# Patient Record
Sex: Female | Born: 2005 | Race: White | Hispanic: No | Marital: Single | Smoking: Never smoker
Health system: Southern US, Community
[De-identification: ages and names within clinical notes are randomized; demographics above are authoritative.]

## PROBLEM LIST (undated history)

## (undated) DIAGNOSIS — R638 Other symptoms and signs concerning food and fluid intake: Secondary | ICD-10-CM

## (undated) DIAGNOSIS — IMO0001 Reserved for inherently not codable concepts without codable children: Secondary | ICD-10-CM

## (undated) DIAGNOSIS — Q970 Karyotype 47, XXX: Secondary | ICD-10-CM

## (undated) DIAGNOSIS — T7840XA Allergy, unspecified, initial encounter: Secondary | ICD-10-CM

## (undated) DIAGNOSIS — R0681 Apnea, not elsewhere classified: Secondary | ICD-10-CM

## (undated) DIAGNOSIS — J45909 Unspecified asthma, uncomplicated: Secondary | ICD-10-CM

## (undated) DIAGNOSIS — K219 Gastro-esophageal reflux disease without esophagitis: Secondary | ICD-10-CM

## (undated) DIAGNOSIS — Q929 Trisomy and partial trisomy of autosomes, unspecified: Secondary | ICD-10-CM

## (undated) HISTORY — DX: Gastro-esophageal reflux disease without esophagitis: K21.9

## (undated) HISTORY — PX: ELBOW CLOSED REDUCTION W/ PERCUANEOUS PINNING: SHX1492

## (undated) HISTORY — DX: Unspecified asthma, uncomplicated: J45.909

## (undated) HISTORY — DX: Allergy, unspecified, initial encounter: T78.40XA

## (undated) HISTORY — DX: Trisomy and partial trisomy of autosomes, unspecified: Q92.9

## (undated) HISTORY — DX: Reserved for inherently not codable concepts without codable children: IMO0001

## (undated) HISTORY — DX: Apnea, not elsewhere classified: R06.81

---
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
## 2023-02-05 ENCOUNTER — Other Ambulatory Visit: Payer: Self-pay | Admitting: Nurse Practitioner

## 2023-02-05 DIAGNOSIS — E119 Type 2 diabetes mellitus without complications: Secondary | ICD-10-CM

## 2023-02-26 ENCOUNTER — Ambulatory Visit (INDEPENDENT_AMBULATORY_CARE_PROVIDER_SITE_OTHER): Payer: Medicaid Other | Admitting: Family Medicine

## 2023-02-26 ENCOUNTER — Encounter: Payer: Self-pay | Admitting: Family Medicine

## 2023-02-26 ENCOUNTER — Ambulatory Visit: Payer: Self-pay | Admitting: Nurse Practitioner

## 2023-02-26 VITALS — BP 100/69 | HR 91 | Ht 72.0 in | Wt 324.0 lb

## 2023-02-26 DIAGNOSIS — S86802A Unspecified injury of other muscle(s) and tendon(s) at lower leg level, left leg, initial encounter: Secondary | ICD-10-CM

## 2023-02-26 MED ORDER — IBUPROFEN 600 MG PO TABS
600.0000 mg | ORAL_TABLET | Freq: Three times a day (TID) | ORAL | 0 refills | Status: AC | PRN
Start: 2023-02-26 — End: ?

## 2023-02-26 NOTE — Progress Notes (Signed)
BP 100/69   Pulse 91   Ht 6' (1.829 m)   Wt (!) 324 lb (147 kg)   SpO2 98%   BMI 43.94 kg/m    Subjective:   Patient ID: Rachel Joseph, female    DOB: Apr 06, 2005, 17 y.o.   MRN: [id number]  HPI: Rachel Joseph is a 17 y.o. female presenting on 02/26/2023 for left leg pain (Injury occurred back in October/Swelling started back up 1 week ago) and Medical Management of Chronic Issues   HPI Left calf pain and knee pain and swelling Patient is coming in with complaints of left calf pain and pain and swelling behind her knee and down into her leg.  She says this all started about a month ago when she slipped and felt some sharp pain in her left calf and then had some swelling.  That lasted a week or 2 and then calm to down but then recently over the past week she started having that same pain in the same area that she aggravated before.  She is also has a little bit of swelling in the knee and down the leg as well that she has noticed.  She is able to ambulate but it does hurt to ambulate in the back of her calf.  She says sometimes her knee will pop and catch at times.  Relevant past medical, surgical, family and social history reviewed and updated as indicated. Interim medical history since our last visit reviewed. Allergies and medications reviewed and updated.  Review of Systems  Constitutional:  Negative for chills and fever.  HENT:  Negative for congestion, ear discharge and ear pain.   Eyes:  Negative for visual disturbance.  Respiratory:  Negative for chest tightness and shortness of breath.   Cardiovascular:  Negative for chest pain and leg swelling.  Genitourinary:  Negative for difficulty urinating and dysuria.  Musculoskeletal:  Positive for arthralgias, joint swelling and myalgias. Negative for back pain and gait problem.  Skin:  Negative for rash.  Neurological:  Negative for light-headedness and headaches.  Psychiatric/Behavioral:  Negative for agitation and behavioral  problems.   All other systems reviewed and are negative.   Per HPI unless specifically indicated above   Allergies as of 02/26/2023       Reactions   Amoxicillin Rash        Medication List        Accurate as of February 26, 2023  3:29 PM. If you have any questions, ask your nurse or doctor.          STOP taking these medications    ibuprofen 100 MG/5ML suspension Commonly known as: ADVIL Replaced by: ibuprofen 600 MG tablet Stopped by: Elige Radon Wylan Gentzler       TAKE these medications    Accu-Chek Guide test strip Generic drug: glucose blood Check BS in AM, Noon and QHS Dx E11.65   Accu-Chek Softclix Lancets lancets Check BS in AM, Noon and QHS Dx E11.65   albuterol 108 (90 Base) MCG/ACT inhaler Commonly known as: VENTOLIN HFA Inhale 2 puffs into the lungs every 6 (six) hours as needed for wheezing or shortness of breath.   Blood Glucose Monitoring Suppl Devi 1 each by Does not apply route in the morning, at noon, and at bedtime. May substitute to any manufacturer covered by patient's insurance.   ibuprofen 600 MG tablet Commonly known as: ADVIL Take 1 tablet (600 mg total) by mouth every 8 (eight) hours as needed. Replaces: ibuprofen  100 MG/5ML suspension Started by: Elige Radon Denny Lave   Ozempic (2 MG/DOSE) 8 MG/3ML Sopn Generic drug: Semaglutide (2 MG/DOSE) INJECT 2 MG SUBCUTANEOUSLY ONCE A WEEK   valACYclovir 1000 MG tablet Commonly known as: VALTREX 2 po bid for 1 day at fever blister onset         Objective:   BP 100/69   Pulse 91   Ht 6' (1.829 m)   Wt (!) 324 lb (147 kg)   SpO2 98%   BMI 43.94 kg/m   Wt Readings from Last 3 Encounters:  02/26/23 (!) 324 lb (147 kg) (>99%, Z= 2.97)*  11/09/22 (!) 328 lb (148.8 kg) (>99%, Z= 2.96)*  10/17/22 (!) 338 lb (153.3 kg) (>99%, Z= 2.99)*   * Growth percentiles are based on CDC (Girls, 2-20 Years) data.    Physical Exam Vitals and nursing note reviewed.  Constitutional:      General:  She is not in acute distress.    Appearance: She is well-developed. She is not diaphoretic.  Eyes:     Conjunctiva/sclera: Conjunctivae normal.  Musculoskeletal:        General: No swelling. Normal range of motion.       Legs:  Skin:    General: Skin is warm and dry.     Findings: No rash.  Neurological:     Mental Status: She is alert and oriented to person, place, and time.     Coordination: Coordination normal.  Psychiatric:        Behavior: Behavior normal.       Assessment & Plan:   Problem List Items Addressed This Visit   None Visit Diagnoses       Injury of left plantaris muscle or tendon    -  Primary   Relevant Medications   ibuprofen (ADVIL) 600 MG tablet     Based on exam, likely plantaris muscle tendon.  She also possibly has a meniscal injury as well but let see if we can calm it down first. Recommended exercise and ice and heat and anti-inflammatories and stretching  Possibility with the popping in her knee that she could have a little bit of a meniscus injury as well but we will see how that comes after this plantaris muscle strain or tear calms down. Follow up plan: Return if symptoms worsen or fail to improve.  Counseling provided for all of the vaccine components No orders of the defined types were placed in this encounter.   Arville Care, MD [facility] 02/26/2023, 3:29 PM

## 2023-02-26 NOTE — Telephone Encounter (Signed)
Copied from CRM [id number]. Topic: Clinical - Red Word Triage >> Feb 26, 2023  8:19 AM Ivette P wrote: Red Word that prompted transfer to Nurse Triage: swelling, left calf down.  Chief Complaint: left leg pain Symptoms: Left calf, ankle, foot -below knee pain and swelling Frequency: 3 or 4 days ago Pertinent Negatives: Patient denies SOB or chest pain Disposition: [] ED /[] Urgent Care (no appt availability in office) / [x] Appointment(In office/virtual)/ []  [place] [facility]/ [] Home Care/ [] Refused Recommended Disposition /[] [place] [facility]/ []  Follow-up with PCP Additional Notes: pt's mom called per pt request to schedule appt regarding left leg knee and below pain and swelling.  Pt now limping due to discomfort.  Appt scheduled for today in same office as PCP.  Education given to Mom regarding s/s watch for related to DVT and need to go to ED: Mom verbalized understanding.    Reason for Disposition  SEVERE swelling (e.g., can't move swollen ankle at all)  Answer Assessment - Initial Assessment Questions 1. LOCATION: "Which ankle is swollen?" "Where is the swelling?"     Left calf, ankle, foot -knee and below  swelling and pain 2. ONSET: "When did the swelling start?"     Slipped couple weeks and swelling 3 or 4 days ago 3. SWELLING: "How bad is the swelling?" Or, "How large is it?" (e.g., mild, moderate, severe; size of localized swelling)    - NONE: No joint swelling.   - LOCALIZED: Localized; small area of puffy or swollen skin (e.g., insect bite, skin irritation).   - MILD: Joint looks or feels mildly swollen or puffy.   - MODERATE: Swollen; interferes with normal activities (e.g., work or school); decreased range of movement; may be limping.   - SEVERE: Very swollen; can't move swollen joint at all; limping a lot or unable to walk.     Moderate amount swelling - and longer pt stands the worse it hurts 4. PAIN: "Is there any pain?" If Yes, ask: "How bad is it?" (Scale 1-10; or  mild, moderate, severe)   - NONE (0): no pain.   - MILD (1-3): doesn't interfere with normal activities.    - MODERATE (4-7): interferes with normal activities (e.g., work or school) or awakens from sleep, limping.    - SEVERE (8-10): excruciating pain, unable to do any normal activities, unable to walk.      Limping due to discomfort when walking  5. CAUSE: "What do you think caused the ankle swelling?"     unknown 6. OTHER SYMPTOMS: "Do you have any other symptoms?" (e.g., fever, chest pain, difficulty breathing, calf pain)     Some Calf pain  7. PREGNANCY: "Is there any chance you are pregnant?" "When was your last menstrual period?"      no  Protocols used: Ankle Swelling-A-AH

## 2023-04-23 ENCOUNTER — Encounter: Payer: Self-pay | Admitting: Nurse Practitioner

## 2023-04-23 ENCOUNTER — Ambulatory Visit: Payer: Medicaid Other | Admitting: Nurse Practitioner

## 2023-04-23 VITALS — BP 132/85 | HR 83 | Temp 96.9°F | Ht 72.0 in | Wt 323.0 lb

## 2023-04-23 DIAGNOSIS — M25562 Pain in left knee: Secondary | ICD-10-CM | POA: Diagnosis not present

## 2023-04-23 DIAGNOSIS — Z0001 Encounter for general adult medical examination with abnormal findings: Secondary | ICD-10-CM

## 2023-04-23 DIAGNOSIS — Z7984 Long term (current) use of oral hypoglycemic drugs: Secondary | ICD-10-CM

## 2023-04-23 DIAGNOSIS — Z0000 Encounter for general adult medical examination without abnormal findings: Secondary | ICD-10-CM

## 2023-04-23 DIAGNOSIS — E119 Type 2 diabetes mellitus without complications: Secondary | ICD-10-CM

## 2023-04-23 LAB — BAYER DCA HB A1C WAIVED: HB A1C (BAYER DCA - WAIVED): 5.4 % (ref 4.8–5.6)

## 2023-04-23 MED ORDER — OZEMPIC (2 MG/DOSE) 8 MG/3ML ~~LOC~~ SOPN
2.0000 mg | PEN_INJECTOR | SUBCUTANEOUS | 1 refills | Status: DC
Start: 2023-04-23 — End: 2023-04-25

## 2023-04-23 MED ORDER — PREDNISONE 20 MG PO TABS: 40.0000 mg | ORAL_TABLET | Freq: Every day | ORAL | 0 refills | Status: AC

## 2023-04-23 NOTE — Progress Notes (Signed)
 Subjective:    Patient ID: Rachel Joseph, female    DOB: Dec 25, 2005, 18 y.o.   MRN: [id number]   Chief Complaint: annual physical    HPI:  Rachel Joseph is a 18 y.o. who identifies as a female who was assigned female at birth.   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 Diabetes mellitus treated with oral medication (HCC) Fasting blood sugars have been running around 80-110. Lab Results  Component Value Date   HGBA1C 5.5 10/17/2022     2. 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 Functions well.  3. Morbid obesity with BMI of 40.0-44.9, adult (HCC) No recent weight changes  Wt Readings from Last 3 Encounters:  04/23/23 (!) 323 lb (146.5 kg) (>99%, Z= 2.98)*  02/26/23 (!) 324 lb (147 kg) (>99%, Z= 2.97)*  11/09/22 (!) 328 lb (148.8 kg) (>99%, Z= 2.96)*   * Growth percentiles are based on CDC (Girls, 2-20 Years) data.   BMI Readings from Last 3 Encounters:  04/23/23 43.81 kg/m (>99%, Z= 2.51)*  02/26/23 43.94 kg/m (>99%, Z= 2.54)*  11/09/22 44.48 kg/m (>99%, Z= 2.63)*   * Growth percentiles are based on CDC (Girls, 2-20 Years) data.      New complaints: Left knee pain- has been hurting for 2-3 weeks after a fall. Sight effusion  Allergies  Allergen Reactions   Amoxicillin Rash   Outpatient Encounter Medications as of 04/23/2023  Medication Sig   Accu-Chek Softclix Lancets lancets Check BS in AM, Noon and QHS Dx E11.65   albuterol (VENTOLIN HFA) 108 (90 Base) MCG/ACT inhaler Inhale 2 puffs into the lungs every 6 (six) hours as needed for wheezing or shortness of breath.   Blood Glucose Monitoring Suppl DEVI 1 each by Does not apply route in the morning, at noon, and at bedtime. May substitute to any manufacturer covered by patient's insurance.   glucose blood (ACCU-CHEK GUIDE) test strip Check BS in AM, Noon and QHS Dx E11.65   ibuprofen  (ADVIL) 600 MG tablet Take 1 tablet (600 mg total) by mouth every 8 (eight) hours as needed.   Semaglutide, 2 MG/DOSE, (OZEMPIC, 2 MG/DOSE,) 8 MG/3ML SOPN INJECT 2 MG SUBCUTANEOUSLY ONCE A WEEK   valACYclovir (VALTREX) 1000 MG tablet 2 po bid for 1 day at fever blister onset   No facility-administered encounter medications on file as of 04/23/2023.    Past Surgical History:  Procedure Laterality Date   ELBOW CLOSED REDUCTION W/ PERCUANEOUS PINNING     WRIST ARTHROSCOPY WITH DEBRIDEMENT Left 06/11/2019   Procedure: WRIST ARTHROSCOPY WITH DEBRIDEMENT;  Surgeon: Bradly Bienenstock, MD;  Location: [facility];  Service: Orthopedics;  Laterality: Left;  with IV sedation    Family History  Problem Relation Age of Onset   Diabetes Mother    Neuropathy Mother    Polycystic ovary syndrome Mother    Heart attack Father    Hyperlipidemia Father    Hypertension Father       Controlled substance contract: n/a     Review of Systems  Constitutional:  Negative for diaphoresis.  Eyes:  Negative for pain.  Respiratory:  Negative for shortness of breath.   Cardiovascular:  Negative for chest pain, palpitations and leg swelling.  Gastrointestinal:  Negative for abdominal pain.  Endocrine: Negative for polydipsia.  Skin:  Negative for rash.  Neurological:  Negative for dizziness, weakness  and headaches.  Hematological:  Does not bruise/bleed easily.  All other systems reviewed and are negative.      Objective:   Physical Exam Vitals and nursing note reviewed.  Constitutional:      General: She is not in acute distress.    Appearance: Normal appearance. She is well-developed.  HENT:     Head: Normocephalic.     Right Ear: Tympanic membrane normal.     Left Ear: Tympanic membrane normal.     Nose: Nose normal.     Mouth/Throat:     Mouth: Mucous membranes are moist.  Eyes:     Pupils: Pupils are equal, round, and reactive to light.  Neck:     Vascular: No carotid bruit or  JVD.  Cardiovascular:     Rate and Rhythm: Normal rate and regular rhythm.     Heart sounds: Normal heart sounds.  Pulmonary:     Effort: Pulmonary effort is normal. No respiratory distress.     Breath sounds: Normal breath sounds. No wheezing or rales.  Chest:     Chest wall: No tenderness.  Abdominal:     General: Bowel sounds are normal. There is no distension or abdominal bruit.     Palpations: Abdomen is soft. There is no hepatomegaly, splenomegaly, mass or pulsatile mass.     Tenderness: There is no abdominal tenderness.  Musculoskeletal:        General: Normal range of motion.     Cervical back: Normal range of motion and neck supple.     Comments: FROM of left knee with no pain Mild effusion All ligaments intact  Lymphadenopathy:     Cervical: No cervical adenopathy.  Skin:    General: Skin is warm and dry.  Neurological:     Mental Status: She is alert and oriented to person, place, and time.     Deep Tendon Reflexes: Reflexes are normal and symmetric.  Psychiatric:        Behavior: Behavior normal.        Thought Content: Thought content normal.        Judgment: Judgment normal.    BP 132/85   Pulse 83   Temp (!) 96.9 F (36.1 C) (Temporal)   Ht 6' (1.829 m)   Wt (!) 323 lb (146.5 kg)   SpO2 97%   BMI 43.81 kg/m    Hgba1c 5.4%       Assessment & Plan:  Rachel Joseph comes in today with chief complaint of medical management of chronic issues    Diagnosis and orders addressed:  1. Diabetes mellitus treated with oral medication (HCC) Low carb diet - Bayer DCA Hb A1c Waived - Microalbumin / creatinine urine ratio - Semaglutide, 2 MG/DOSE, 8 MG/3ML SOPN; Inject 2 mg as directed once a week.  Dispense: 3 mL; Refill: 3  2. Trisomy X syndrome  3. Morbid obesity with BMI of 40.0-44.9, adult (HCC) Discussed diet and exercise for person with BMI >25 Will recheck weight in 3-6 months  - CBC with Differential/Platelet - CMP14+EGFR - Lipid panel  4.  Left knee pain Prednisone 20mg  2 tablets daily for 5 days May increase blood sugars Labs pending Health Maintenance reviewed Diet and exercise encouraged  Follow up plan: 6 months   Mary-Margaret [name], FNP

## 2023-04-23 NOTE — Patient Instructions (Signed)
 Acute Knee Pain, Adult Many things can cause knee pain. Sometimes, knee pain is sudden (acute). It may be caused by damage, swelling, or irritation of the muscles and tissues that support your knee. Pain may come from: A fall. An injury to the knee from twisting motions. A hit to the knee. Infection. The pain often goes away on its own with time and rest. If the pain does not go away, tests may be done to find out what is causing the pain. These may include: Imaging tests, such as an X-ray, MRI, CT scan, or ultrasound. Joint aspiration. In this test, fluid is removed from the knee and checked. Arthroscopy. In this test, a lighted tube is put in the knee and an image is shown on a screen. A biopsy. In this test, a health care provider will remove a small piece of tissue for testing. Follow these instructions at home: If you have a knee sleeve or brace that can be taken off:  Wear the knee sleeve or brace as told by your provider. Take it off only if your provider says that you can. Check the skin around it every day. Tell your provider if you see problems. Loosen the knee sleeve or brace if your toes tingle, are numb, or turn cold and blue. Keep the knee sleeve or brace clean and dry. Bathing If the knee sleeve or brace is not waterproof: Do not let it get wet. Cover it when you take a bath or shower. Use a cover that does not let any water in. Managing pain, stiffness, and swelling  If told, put ice on the area. If you have a knee sleeve or brace that you can take off, remove it as told. Put ice in a plastic bag. Place a towel between your skin and the bag. Leave the ice on for 20 minutes, 2-3 times a day. If your skin turns bright red, take off the ice right away to prevent skin damage. The risk of damage is higher if you cannot feel pain, heat, or cold. Move your toes often to reduce stiffness and swelling. Raise the injured area above the level of your heart while you are sitting  or lying down. Use a pillow to support your foot as needed. If told, use an elastic bandage to put pressure (compression) on your injured knee. This may control swelling, give support, and help with discomfort. Sleep with a pillow under your knee. Activity Rest your knee. Do not do things that cause pain or make pain worse. Do not stand or walk on your injured knee until you're told it's okay. Use crutches as told. Avoid activities where both feet leave the ground at the same time and put stress on the joints. Avoid running, jumping rope, and doing jumping jacks. Work with a physical therapist to make a safe exercise program if told. Physical therapy helps your knee move better and get stronger. Exercise as told. General instructions Take your medicines only as told by your provider. If you are overweight, work with your provider and an expert in healthy eating, called a dietician, to set goals to lose weight. Being overweight can make your knee hurt more. Do not smoke, vape, or use products with nicotine or tobacco in them. If you need help quitting, talk with your provider. Return to normal activities when you are told. Ask what things are safe for you to do. Watch for any changes in your symptoms. Keep all follow-up visits. Your provider will check  your healing and adjust treatments if needed. Contact a health care provider if: The knee pain does not stop. The knee pain changes or gets worse. You have a fever along with knee pain. Your knee is red or feels warm when you touch it. Your knee gives out or locks up. Get help right away if: Your knee swells and the swelling gets worse. You cannot move your knee. You have very bad knee pain that does not get better with medicine. This information is not intended to replace advice given to you by your health care provider. Make sure you discuss any questions you have with your health care provider. Document Revised: 11/16/2022 Document  Reviewed: 04/10/2022 Elsevier Patient Education  2024 [employer].

## 2023-04-24 LAB — CMP14+EGFR
ALT: 18 [IU]/L (ref 0–32)
AST: 18 [IU]/L (ref 0–40)
Albumin: 4.1 g/dL (ref 4.0–5.0)
Alkaline Phosphatase: 91 [IU]/L (ref 42–106)
BUN/Creatinine Ratio: 17 (ref 9–23)
BUN: 9 mg/dL (ref 6–20)
Bilirubin Total: 0.4 mg/dL (ref 0.0–1.2)
CO2: 22 mmol/L (ref 20–29)
Calcium: 9.4 mg/dL (ref 8.7–10.2)
Chloride: 105 mmol/L (ref 96–106)
Creatinine, Ser: 0.53 mg/dL — ABNORMAL LOW (ref 0.57–1.00)
Globulin, Total: 3.1 g/dL (ref 1.5–4.5)
Glucose: 82 mg/dL (ref 70–99)
Potassium: 5 mmol/L (ref 3.5–5.2)
Sodium: 142 mmol/L (ref 134–144)
Total Protein: 7.2 g/dL (ref 6.0–8.5)
eGFR: 137 mL/min/{1.73_m2} (ref >59)

## 2023-04-24 LAB — CBC WITH DIFFERENTIAL/PLATELET
Basophils Absolute: 0.1 10*3/uL (ref 0.0–0.2)
Basos: 1 %
EOS (ABSOLUTE): 0.4 10*3/uL (ref 0.0–0.4)
Eos: 5 %
Hematocrit: 37.2 % (ref 34.0–46.6)
Hemoglobin: 11.5 g/dL (ref 11.1–15.9)
Immature Grans (Abs): 0 10*3/uL (ref 0.0–0.1)
Immature Granulocytes: 0 %
Lymphocytes Absolute: 2.5 10*3/uL (ref 0.7–3.1)
Lymphs: 29 %
MCH: 26.3 pg — ABNORMAL LOW (ref 26.6–33.0)
MCHC: 30.9 g/dL — ABNORMAL LOW (ref 31.5–35.7)
MCV: 85 fL (ref 79–97)
Monocytes Absolute: 0.7 10*3/uL (ref 0.1–0.9)
Monocytes: 8 %
Neutrophils Absolute: 5 10*3/uL (ref 1.4–7.0)
Neutrophils: 57 %
Platelets: 310 10*3/uL (ref 150–450)
RBC: 4.38 x10E6/uL (ref 3.77–5.28)
RDW: 14.9 % (ref 11.7–15.4)
WBC: 8.7 10*3/uL (ref 3.4–10.8)

## 2023-04-24 LAB — LIPID PANEL
Chol/HDL Ratio: 5.1 {ratio} — ABNORMAL HIGH (ref 0.0–4.4)
Cholesterol, Total: 162 mg/dL (ref 100–169)
HDL: 32 mg/dL — ABNORMAL LOW (ref >39)
LDL Chol Calc (NIH): 110 mg/dL — ABNORMAL HIGH (ref 0–109)
Triglycerides: 108 mg/dL — ABNORMAL HIGH (ref 0–89)
VLDL Cholesterol Cal: 20 mg/dL (ref 5–40)

## 2023-04-25 ENCOUNTER — Telehealth: Payer: Self-pay | Admitting: *Deleted

## 2023-04-25 DIAGNOSIS — E119 Type 2 diabetes mellitus without complications: Secondary | ICD-10-CM

## 2023-04-25 MED ORDER — OZEMPIC (2 MG/DOSE) 8 MG/3ML ~~LOC~~ SOPN: 2.0000 mg | PEN_INJECTOR | SUBCUTANEOUS | 1 refills | Status: DC

## 2023-04-25 NOTE — Telephone Encounter (Signed)
 Fax from [facility] pharmacy RE: Ozempic directions Per written note on fax response, this is to be weekly Script changed and sent back

## 2023-05-09 ENCOUNTER — Other Ambulatory Visit (HOSPITAL_COMMUNITY): Payer: Self-pay

## 2023-06-06 ENCOUNTER — Other Ambulatory Visit (HOSPITAL_COMMUNITY): Payer: Self-pay

## 2023-06-06 ENCOUNTER — Telehealth: Payer: Self-pay | Admitting: Pharmacy Technician

## 2023-06-06 NOTE — Telephone Encounter (Signed)
 Pharmacy Patient Advocate Encounter   Received notification from CoverMyMeds that prior authorization for Ozempic (0.25 or 0.5 MG/DOSE) 2MG /3ML pen-injectors is required/requested.   Insurance verification completed.   The patient is insured through [employer] .   Per test claim: Refill too soon. PA is not needed at this time. Medication was filled 04/25/2023. Next eligible fill date is 06/27/2023.  PT NOW ON 2MG /DOSE. ARCHIVED KEY# [id number]

## 2023-06-08 ENCOUNTER — Telehealth: Payer: Self-pay | Admitting: Nurse Practitioner

## 2023-06-27 ENCOUNTER — Encounter: Payer: Self-pay | Admitting: Family Medicine

## 2023-06-27 ENCOUNTER — Ambulatory Visit: Payer: Self-pay

## 2023-06-27 ENCOUNTER — Ambulatory Visit: Admitting: Family Medicine

## 2023-06-27 ENCOUNTER — Ambulatory Visit (INDEPENDENT_AMBULATORY_CARE_PROVIDER_SITE_OTHER)

## 2023-06-27 VITALS — BP 131/84 | HR 75 | Temp 96.7°F | Ht 73.0 in | Wt 328.0 lb

## 2023-06-27 DIAGNOSIS — M79671 Pain in right foot: Secondary | ICD-10-CM | POA: Diagnosis not present

## 2023-06-27 DIAGNOSIS — M7989 Other specified soft tissue disorders: Secondary | ICD-10-CM

## 2023-06-27 DIAGNOSIS — S99921A Unspecified injury of right foot, initial encounter: Secondary | ICD-10-CM

## 2023-06-27 NOTE — Progress Notes (Signed)
 Subjective:  Patient ID: Rachel Joseph, female    DOB: [birth year], 18 y.o.   MRN: [id number]  Patient Care Team: Delfina Feller, FNP as PCP - General (Family Medicine)   Chief Complaint:  Foot Pain (Right foot pain after a fall x 1 month ago )   HPI: Rachel Joseph is a 18 y.o. female presenting on 06/27/2023 for Foot Pain (Right foot pain after a fall x 1 month ago )    History of Present Illness   Rachel Joseph is a 17 year old female who presents with foot pain after tripping a month ago.  She injured her foot approximately one month ago when she tripped over a cage while feeding chickens. The pain is localized to her big toe and the area underneath it, persisting since the incident.  The pain exacerbates when walking on the affected foot. She has not sought medical attention for this issue prior to this visit. She has been taking over-the-counter medications for symptom relief, although the specific medication was not mentioned.          Relevant past medical, surgical, family, and social history reviewed and updated as indicated.  Allergies and medications reviewed and updated. Data reviewed: Chart in Epic.   Past Medical History:  Diagnosis Date   47,XXX identified by routine karyotyping    Allergy    Asthma    as a small child   Increased body mass index (BMI)    Reflux     Past Surgical History:  Procedure Laterality Date   ELBOW CLOSED REDUCTION W/ PERCUANEOUS PINNING     WRIST ARTHROSCOPY WITH DEBRIDEMENT Left 06/11/2019   Procedure: WRIST ARTHROSCOPY WITH DEBRIDEMENT;  Surgeon: Arvil Birks, MD;  Location: [facility];  Service: Orthopedics;  Laterality: Left;  with IV sedation    Social History   Socioeconomic History   Marital status: Single    Spouse name: Not on file   Number of children: Not on file   Years of education: Not on file   Highest education level: Not on file  Occupational History   Not on file  Tobacco  Use   Smoking status: Never   Smokeless tobacco: Never  Vaping Use   Vaping status: Never Used  Substance and Sexual Activity   Alcohol use: No   Drug use: No   Sexual activity: Not on file  Other Topics Concern   Not on file  Social History Narrative   Not on file   Social Drivers of Health   Financial Resource Strain: Not on file  Food Insecurity: Not on file  Transportation Needs: Not on file  Physical Activity: Not on file  Stress: Not on file  Social Connections: Not on file  Intimate Partner Violence: Not on file    Outpatient Encounter Medications as of 06/27/2023  Medication Sig   Accu-Chek Softclix Lancets lancets Check BS in AM, Noon and QHS Dx E11.65   albuterol  (VENTOLIN  HFA) 108 (90 Base) MCG/ACT inhaler Inhale 2 puffs into the lungs every 6 (six) hours as needed for wheezing or shortness of breath.   Blood Glucose Monitoring Suppl DEVI 1 each by Does not apply route in the morning, at noon, and at bedtime. May substitute to any manufacturer covered by patient's insurance.   glucose blood (ACCU-CHEK GUIDE) test strip Check BS in AM, Noon and QHS Dx E11.65   ibuprofen  (ADVIL ) 600 MG tablet Take 1 tablet (600 mg total) by mouth  every 8 (eight) hours as needed.   Semaglutide , 2 MG/DOSE, (OZEMPIC , 2 MG/DOSE,) 8 MG/3ML SOPN Inject 2 mg into the skin once a week.   valACYclovir  (VALTREX ) 1000 MG tablet 2 po bid for 1 day at fever blister onset   No facility-administered encounter medications on file as of 06/27/2023.    Allergies  Allergen Reactions   Amoxicillin  Rash    Pertinent ROS per HPI, otherwise unremarkable      Objective:  BP 131/84   Pulse 75   Temp (!) 96.7 F (35.9 C)   Ht 6\' 1"  (1.854 m)   Wt (!) 328 lb (148.8 kg)   SpO2 99%   BMI 43.27 kg/m    Wt Readings from Last 3 Encounters:  06/27/23 (!) 328 lb (148.8 kg)  04/23/23 (!) 323 lb (146.5 kg) (>99%, Z= 2.98)*  02/26/23 (!) 324 lb (147 kg) (>99%, Z= 2.97)*   * Growth percentiles are  based on CDC (Girls, 2-20 Years) data.    Physical Exam Vitals and nursing note reviewed.  Constitutional:      General: She is not in acute distress.    Appearance: Normal appearance. She is morbidly obese. She is not ill-appearing, toxic-appearing or diaphoretic.  HENT:     Head: Normocephalic and atraumatic.     Nose: Nose normal.     Mouth/Throat:     Mouth: Mucous membranes are moist.  Eyes:     Pupils: Pupils are equal, round, and reactive to light.  Cardiovascular:     Rate and Rhythm: Normal rate and regular rhythm.     Pulses: Normal pulses.          Dorsalis pedis pulses are 2+ on the right side.       Posterior tibial pulses are 2+ on the right side.     Heart sounds: Normal heart sounds.  Pulmonary:     Breath sounds: Normal breath sounds.  Musculoskeletal:     Right lower leg: No edema.     Left lower leg: No edema.     Right foot: Normal range of motion. Bunion (mild) present. No deformity, Charcot foot, foot drop or prominent metatarsal heads.       Feet:  Feet:     Right foot:     Protective Sensation: 10 sites tested.  10 sites sensed.     Skin integrity: Skin integrity normal.  Skin:    General: Skin is warm and dry.     Capillary Refill: Capillary refill takes less than 2 seconds.  Neurological:     General: No focal deficit present.     Mental Status: She is alert and oriented to person, place, and time.  Psychiatric:        Mood and Affect: Mood normal.        Behavior: Behavior normal. Behavior is cooperative.      Results for orders placed or performed in visit on 04/23/23  Bayer DCA Hb A1c Waived   Collection Time: 04/23/23 10:47 AM  Result Value Ref Range   HB A1C (BAYER DCA - WAIVED) 5.4 4.8 - 5.6 %  CBC with Differential/Platelet   Collection Time: 04/23/23 10:50 AM  Result Value Ref Range   WBC 8.7 3.4 - 10.8 x10E3/uL   RBC 4.38 3.77 - 5.28 x10E6/uL   Hemoglobin 11.5 11.1 - 15.9 g/dL   Hematocrit 19.1 47.8 - 46.6 %   MCV 85 79 - 97  fL   MCH 26.3 (L) 26.6 - 33.0 pg  MCHC 30.9 (L) 31.5 - 35.7 g/dL   RDW 29.5 62.1 - 30.8 %   Platelets 310 150 - 450 x10E3/uL   Neutrophils 57 Not Estab. %   Lymphs 29 Not Estab. %   Monocytes 8 Not Estab. %   Eos 5 Not Estab. %   Basos 1 Not Estab. %   Neutrophils Absolute 5.0 1.4 - 7.0 x10E3/uL   Lymphocytes Absolute 2.5 0.7 - 3.1 x10E3/uL   Monocytes Absolute 0.7 0.1 - 0.9 x10E3/uL   EOS (ABSOLUTE) 0.4 0.0 - 0.4 x10E3/uL   Basophils Absolute 0.1 0.0 - 0.2 x10E3/uL   Immature Granulocytes 0 Not Estab. %   Immature Grans (Abs) 0.0 0.0 - 0.1 x10E3/uL  CMP14+EGFR   Collection Time: 04/23/23 10:50 AM  Result Value Ref Range   Glucose 82 70 - 99 mg/dL   BUN 9 6 - 20 mg/dL   Creatinine, Ser 6.57 (L) 0.57 - 1.00 mg/dL   eGFR 846 >96 EX/BMW/4.13   BUN/Creatinine Ratio 17 9 - 23   Sodium 142 134 - 144 mmol/L   Potassium 5.0 3.5 - 5.2 mmol/L   Chloride 105 96 - 106 mmol/L   CO2 22 20 - 29 mmol/L   Calcium 9.4 8.7 - 10.2 mg/dL   Total Protein 7.2 6.0 - 8.5 g/dL   Albumin 4.1 4.0 - 5.0 g/dL   Globulin, Total 3.1 1.5 - 4.5 g/dL   Bilirubin Total 0.4 0.0 - 1.2 mg/dL   Alkaline Phosphatase 91 42 - 106 IU/L   AST 18 0 - 40 IU/L   ALT 18 0 - 32 IU/L  Lipid panel   Collection Time: 04/23/23 10:50 AM  Result Value Ref Range   Cholesterol, Total 162 100 - 169 mg/dL   Triglycerides 244 (H) 0 - 89 mg/dL   HDL 32 (L) >01 mg/dL   VLDL Cholesterol Cal 20 5 - 40 mg/dL   LDL Chol Calc (NIH) 027 (H) 0 - 109 mg/dL   Chol/HDL Ratio 5.1 (H) 0.0 - 4.4 ratio       Pertinent labs & imaging results that were available during my care of the patient were reviewed by me and considered in my medical decision making.  Assessment & Plan:  Regann was seen today for foot pain.  Diagnoses and all orders for this visit:  Right foot pain -     DG Foot Complete Right  Swelling of right foot -     DG Foot Complete Right  Injury of right foot, initial encounter -     DG Foot Complete Right      Assessment and Plan    Sprain of toe joint Sprain of the toe joint, likely involving the big toe, with persistent pain for a month. X-ray shows no bone abnormalities. Pain occurs during ambulation. No prior medical evaluation for this injury. - Advise use of acetaminophen  or ibuprofen  as needed for pain - Apply Ace wrap for support - Monitor for any changes in radiologist's x-ray interpretation  Hallux varus Presence of hallux varus, contributing to bunion formation. Supportive shoes recommended to alleviate symptoms. - Recommend supportive shoes          Continue all other maintenance medications.  Follow up plan: Return if symptoms worsen or fail to improve.   Continue healthy lifestyle choices, including diet (rich in fruits, vegetables, and lean proteins, and low in salt and simple carbohydrates) and exercise (at least 30 minutes of moderate physical activity daily).  Educational handout given for foot  sprain  The above assessment and management plan was discussed with the patient. The patient verbalized understanding of and has agreed to the management plan. Patient is aware to call the clinic if they develop any new symptoms or if symptoms persist or worsen. Patient is aware when to return to the clinic for a follow-up visit. Patient educated on when it is appropriate to go to the emergency department.   Kattie Parrot, FNP-C [facility] [phone]

## 2023-06-27 NOTE — Telephone Encounter (Signed)
 Information obtained from the mother, Abran Abrahams. Pt present for phone call as well.  Chief Complaint: R foot pain near great toe Symptoms: pain, bruising, redness near toe Frequency: couple weeks Pertinent Negatives: Patient denies fever, hx of gout Disposition: [] ED /[] Urgent Care (no appt availability in office) / [x] Appointment(In office/virtual)/ []  [facility]/ [] Home Care/ [] Refused Recommended Disposition /[] [facility]/ []  Follow-up with PCP Additional Notes: Pt states that she was on vacation and tripped. Pt states that since then she has had foot pain near great toe on R foot. Pt states that there is a faint bruise and the redness is to the great toe area. Pt states that she does not have a hx of gout. Pt also states that she can walk on it, but becomes increasingly painful the more she walks on it. Pt scheduled with DOD today.   Copied from CRM [id number]. Topic: Clinical - Red Word Triage >> Jun 27, 2023  7:50 AM Hamdi H wrote: [facility] that prompted transfer to Nurse Triage: Right foot hurts to walk on, bruised on the side of foot. The patients mom is worried that it might be fractured. Reason for Disposition  [1] Limp when walking AND [2] due to a twisted ankle or foot  Answer Assessment - Initial Assessment Questions 1. MECHANISM: "How did the injury happen?" (e.g., twisting injury, direct blow)      tripped 2. ONSET: "When did the injury happen?" (Minutes or hours ago)      Couple weeks 3. LOCATION: "Where is the injury located?"      R foot 4. APPEARANCE of INJURY: "What does the injury look like?"      Faint bruise, great toe has redness 5. WEIGHT-BEARING: "Can you put weight on that foot?" "Can you walk (four steps or more)?"       Can walk but after some time she has to walk more on her heel d/t pain in the toe 6. SIZE: For cuts, bruises, or swelling, ask: "How large is it?" (e.g., inches or centimeters;  entire joint)      denies 7. PAIN: "Is there  pain?" If Yes, ask: "How bad is the pain?"    (e.g., Scale 1-10; or mild, moderate, severe)   - NONE (0): no pain.   - MILD (1-3): doesn't interfere with normal activities.    - MODERATE (4-7): interferes with normal activities (e.g., work or school) or awakens from sleep, limping.    - SEVERE (8-10): excruciating pain, unable to do any normal activities, unable to walk.      8 9. OTHER SYMPTOMS: "Do you have any other symptoms?"      denies 10. PREGNANCY: "Is there any chance you are pregnant?" "When was your last menstrual period?"       denies  Protocols used: Ankle and Foot Injury-A-AH

## 2023-06-27 NOTE — Telephone Encounter (Signed)
 Appt schedule today

## 2023-07-18 LAB — HM DIABETES EYE EXAM

## 2023-07-20 DIAGNOSIS — H5213 Myopia, bilateral: Secondary | ICD-10-CM | POA: Diagnosis not present

## 2023-08-03 ENCOUNTER — Encounter: Payer: Self-pay | Admitting: Family Medicine

## 2023-10-08 ENCOUNTER — Other Ambulatory Visit: Payer: Self-pay | Admitting: Nurse Practitioner

## 2023-10-08 DIAGNOSIS — E119 Type 2 diabetes mellitus without complications: Secondary | ICD-10-CM

## 2023-11-20 ENCOUNTER — Ambulatory Visit: Payer: Medicaid Other | Admitting: Nurse Practitioner

## 2023-11-24 ENCOUNTER — Other Ambulatory Visit: Payer: Self-pay | Admitting: Nurse Practitioner

## 2023-12-18 ENCOUNTER — Ambulatory Visit: Admitting: Nurse Practitioner

## 2023-12-18 ENCOUNTER — Encounter: Payer: Self-pay | Admitting: Nurse Practitioner

## 2023-12-18 VITALS — BP 129/85 | HR 85 | Temp 97.3°F | Ht 73.0 in | Wt 317.0 lb

## 2023-12-18 DIAGNOSIS — E119 Type 2 diabetes mellitus without complications: Secondary | ICD-10-CM

## 2023-12-18 DIAGNOSIS — Z6841 Body Mass Index (BMI) 40.0 and over, adult: Secondary | ICD-10-CM | POA: Diagnosis not present

## 2023-12-18 DIAGNOSIS — Z7984 Long term (current) use of oral hypoglycemic drugs: Secondary | ICD-10-CM | POA: Diagnosis not present

## 2023-12-18 DIAGNOSIS — Q970 Karyotype 47, XXX: Secondary | ICD-10-CM | POA: Diagnosis not present

## 2023-12-18 LAB — BAYER DCA HB A1C WAIVED: HB A1C (BAYER DCA - WAIVED): 4.9 % (ref 4.8–5.6)

## 2023-12-18 MED ORDER — OZEMPIC (2 MG/DOSE) 8 MG/3ML ~~LOC~~ SOPN
8.0000 mg | PEN_INJECTOR | SUBCUTANEOUS | 1 refills | Status: DC
Start: 2023-12-18 — End: 2024-01-01

## 2023-12-18 NOTE — Patient Instructions (Signed)

## 2023-12-18 NOTE — Progress Notes (Signed)
 Subjective:    Patient ID: Rachel Joseph, female    DOB: 11-Feb-2006, 18 y.o.   MRN: [id number]   Chief Complaint: medical management of chronic issues     HPI:  Rachel Joseph is a 18 y.o. who identifies as a female who was assigned female at birth.   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 Diabetes mellitus treated with oral medication (HCC) Fasting blood sugars have been running around 80-110. Lab Results  Component Value Date   HGBA1C 5.4 04/23/2023     2. 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 Functions well.  3. Morbid obesity with BMI of 40.0-44.9, adult (HCC) Weight is down 11lbs  Wt Readings from Last 3 Encounters:  12/18/23 (!) 317 lb (143.8 kg)  06/27/23 (!) 328 lb (148.8 kg)  04/23/23 (!) 323 lb (146.5 kg) (>99%, Z= 2.98)*   * Growth percentiles are based on CDC (Girls, 2-20 Years) data.   BMI Readings from Last 3 Encounters:  12/18/23 41.82 kg/m  06/27/23 43.27 kg/m  04/23/23 43.81 kg/m (>99%, Z= 2.51, 138% of 95%ile)*   * Growth percentiles are based on CDC (Girls, 2-20 Years) data.         New complaints: None today  Allergies  Allergen Reactions   Amoxicillin  Rash   Outpatient Encounter Medications as of 12/18/2023  Medication Sig   Accu-Chek Softclix Lancets lancets Check BS in AM, Noon and QHS Dx E11.65   albuterol  (VENTOLIN  HFA) 108 (90 Base) MCG/ACT inhaler Inhale 2 puffs into the lungs every 6 (six) hours as needed for wheezing or shortness of breath.   Blood Glucose Monitoring Suppl DEVI 1 each by Does not apply route in the morning, at noon, and at bedtime. May substitute to any manufacturer covered by patient's insurance.   glucose blood (ACCU-CHEK GUIDE) test strip Check BS in AM, Noon and QHS Dx E11.65   ibuprofen  (ADVIL ) 600 MG tablet Take 1 tablet (600 mg total) by mouth every 8 (eight) hours as  needed.   OZEMPIC , 2 MG/DOSE, 8 MG/3ML SOPN INJECT 2 MG SUBCUTANEOUSLY ONCE A WEEK   valACYclovir  (VALTREX ) 1000 MG tablet 2 po bid for 1 day at fever blister onset   No facility-administered encounter medications on file as of 12/18/2023.    Past Surgical History:  Procedure Laterality Date   ELBOW CLOSED REDUCTION W/ PERCUANEOUS PINNING     WRIST ARTHROSCOPY WITH DEBRIDEMENT Left 06/11/2019   Procedure: WRIST ARTHROSCOPY WITH DEBRIDEMENT;  Surgeon: Shari Easter, MD;  Location: [facility];  Service: Orthopedics;  Laterality: Left;  with IV sedation    Family History  Problem Relation Age of Onset   Diabetes Mother    Neuropathy Mother    Polycystic ovary syndrome Mother    Heart attack Father    Hyperlipidemia Father    Hypertension Father       Controlled substance contract: n/a     Review of Systems  Constitutional:  Negative for diaphoresis.  Eyes:  Negative for pain.  Respiratory:  Negative for shortness of breath.   Cardiovascular:  Negative for chest pain, palpitations and leg swelling.  Gastrointestinal:  Negative for abdominal pain.  Endocrine: Negative for polydipsia.  Skin:  Negative for rash.  Neurological:  Negative for dizziness, weakness and headaches.  Hematological:  Does not bruise/bleed easily.  All other systems reviewed and are negative.  Objective:   Physical Exam Vitals and nursing note reviewed.  Constitutional:      General: She is not in acute distress.    Appearance: Normal appearance. She is well-developed.  HENT:     Head: Normocephalic.     Right Ear: Tympanic membrane normal.     Left Ear: Tympanic membrane normal.     Nose: Nose normal.     Mouth/Throat:     Mouth: Mucous membranes are moist.  Eyes:     Pupils: Pupils are equal, round, and reactive to light.  Neck:     Vascular: No carotid bruit or JVD.  Cardiovascular:     Rate and Rhythm: Normal rate and regular rhythm.     Heart sounds: Normal heart  sounds.  Pulmonary:     Effort: Pulmonary effort is normal. No respiratory distress.     Breath sounds: Normal breath sounds. No wheezing or rales.  Chest:     Chest wall: No tenderness.  Abdominal:     General: Bowel sounds are normal. There is no distension or abdominal bruit.     Palpations: Abdomen is soft. There is no hepatomegaly, splenomegaly, mass or pulsatile mass.     Tenderness: There is no abdominal tenderness.  Musculoskeletal:        General: Normal range of motion.     Cervical back: Normal range of motion and neck supple.  Lymphadenopathy:     Cervical: No cervical adenopathy.  Skin:    General: Skin is warm and dry.  Neurological:     Mental Status: She is alert and oriented to person, place, and time.     Deep Tendon Reflexes: Reflexes are normal and symmetric.  Psychiatric:        Behavior: Behavior normal.        Thought Content: Thought content normal.        Judgment: Judgment normal.    BP 129/85   Pulse 85   Temp (!) 97.3 F (36.3 C) (Temporal)   Ht 6' 1 (1.854 m)   Wt (!) 317 lb (143.8 kg)   SpO2 98%   BMI 41.82 kg/m    Hgba1c 4.9%       Assessment & Plan:  Rachel Joseph comes in today with chief complaint of medical management of chronic issues    Diagnosis and orders addressed:  1. Diabetes mellitus treated with oral medication (HCC) Low carb diet - Bayer DCA Hb A1c Waived - Microalbumin / creatinine urine ratio - Semaglutide , 2 MG/DOSE, 8 MG/3ML SOPN; Inject 2 mg as directed once a week.  Dispense: 3 mL; Refill: 3  2. Trisomy X syndrome  3. Morbid obesity with BMI of 40.0-44.9, adult (HCC) Discussed diet and exercise for person with BMI >25 Will recheck weight in 3-6 months  - CBC with Differential/Platelet - CMP14+EGFR - Lipid panel   Labs pending Health Maintenance reviewed Diet and exercise encouraged  Follow up plan: 6 months   Rachel Gladis, FNP

## 2023-12-19 LAB — MICROALBUMIN / CREATININE URINE RATIO
Creatinine, Urine: 319.9 mg/dL
Microalb/Creat Ratio: 12 mg/g{creat} (ref 0–29)
Microalbumin, Urine: 38.6 ug/mL

## 2023-12-31 ENCOUNTER — Other Ambulatory Visit: Payer: Self-pay | Admitting: Nurse Practitioner

## 2023-12-31 DIAGNOSIS — E119 Type 2 diabetes mellitus without complications: Secondary | ICD-10-CM

## 2024-02-26 ENCOUNTER — Telehealth: Payer: Self-pay | Admitting: Nurse Practitioner

## 2024-02-26 MED ORDER — SEMAGLUTIDE (1 MG/DOSE) 4 MG/3ML ~~LOC~~ SOPN: 1.0000 mg | PEN_INJECTOR | SUBCUTANEOUS | 1 refills | Status: AC

## 2024-02-26 NOTE — Telephone Encounter (Signed)
 Patient has been on ozempic  for diabetes and weight loss. Is currently on 2mg  weekly. That is causing severe nausea and vomiting whenever she takes it. She did well on previous dose. Would like to go back down to  lower dose.  Meds ordered this encounter  Medications   Semaglutide , 1 MG/DOSE, 4 MG/3ML SOPN    Sig: Inject 1 mg into the skin once a week.    Dispense:  3 mL    Refill:  1    Supervising Provider:   MARYANNE CHEW A [[id number]]   Mary-Margaret Gladis, FNP

## 2024-03-06 ENCOUNTER — Ambulatory Visit: Payer: Self-pay

## 2024-03-06 NOTE — Telephone Encounter (Signed)
 FYI Only or Action Required?: Action required by provider: Mother calling Orthopedic surgeon's office first and if a referral is needed she will call back for patient having left thumb pain with a knot x a few months in an area of previous surgery.  Patient was last seen in primary care on 12/18/2023 by Gladis Mustard, FNP.  Called Nurse Triage reporting Hand Pain.  Symptoms began several weeks ago.  Interventions attempted: OTC medications: Ibuprofen , Rest, hydration, or home remedies, and Ice/heat application.  Symptoms are: unchanged.  Triage Disposition: See PCP When Office is Open (Within 3 Days)  Patient/caregiver understands and will follow disposition?: Yes, but will wait            Copied from CRM [id number]. Topic: Clinical - Red Word Triage >> Mar 06, 2024 11:05 AM Antwanette L wrote: Red Word that prompted transfer to Nurse Triage: Barnie, the patient's mother, is calling because the patient's left hand is in pain, has a knot, and is swollen. She is asking whether she should schedule an appointment with the PCP or with orthopedics Reason for Disposition  [1] MODERATE pain (e.g., interferes with normal activities) AND [2] present > 3 days  Answer Assessment - Initial Assessment Questions Mother called and states that the patient's left hand has had surgery in the past--patient had a detached tendon at that time--pain x a few weeks Surgery was in 2024. No known injuries A little swelling around the thumb A soft knot is felt near base of thumb Patient does not have a fever Pain 5-7 on pain scale  8. PREGNANCY: Is there any chance you are pregnant? When was your last menstrual period?     No  Patient has been icing it and if she has a lot of pain she will take over the counter medication for the pain  Mother wants to know if the patient has to come to her PCP first to get a referral or if she can go back to the Orthopedic Surgeon's office to have this  evaluated. Mother is going to call the Orthopedic office to see if they require a referral from PCP for this, even though she has been there and had surgery on this area in the recent 2 years and if they do require it she will call us  back.  Patient's mother is advised to call us  back if anything changes or with any further questions/concerns. She is advised that if anything worsens to go to Urgent Care or the Emergency Room. Patient's mother verbalized understanding.  Protocols used: Hand Pain-A-AH

## 2024-03-26 ENCOUNTER — Ambulatory Visit: Admitting: Nurse Practitioner

## 2024-03-27 ENCOUNTER — Ambulatory Visit (INDEPENDENT_AMBULATORY_CARE_PROVIDER_SITE_OTHER): Admitting: Nurse Practitioner

## 2024-03-27 ENCOUNTER — Encounter: Payer: Self-pay | Admitting: Nurse Practitioner

## 2024-03-27 ENCOUNTER — Ambulatory Visit: Payer: Self-pay | Admitting: Nurse Practitioner

## 2024-03-27 VITALS — BP 121/89 | HR 94 | Temp 96.3°F | Ht 73.0 in | Wt 313.0 lb

## 2024-03-27 DIAGNOSIS — R509 Fever, unspecified: Secondary | ICD-10-CM | POA: Diagnosis not present

## 2024-03-27 DIAGNOSIS — J029 Acute pharyngitis, unspecified: Secondary | ICD-10-CM

## 2024-03-27 LAB — VERITOR SARS-COV-2 AND FLU A+B
BD Veritor SARS-CoV-2 Ag: NEGATIVE
Influenza A: NEGATIVE
Influenza B: NEGATIVE

## 2024-03-27 MED ORDER — CEFDINIR 300 MG PO CAPS: 300.0000 mg | ORAL_CAPSULE | Freq: Two times a day (BID) | ORAL | 0 refills | Status: AC

## 2024-03-27 NOTE — Progress Notes (Signed)
" ° °  Subjective:    Patient ID: Rachel Joseph, female    DOB: 04/29/2005, 18 y.o.   MRN: [id number]   Chief Complaint: Weakness, Fever, and Generalized Body Aches   Weakness Associated symptoms include congestion, coughing, a fever, a sore throat and weakness.  Fever  Associated symptoms include congestion, coughing and a sore throat.    Patient in today c/o weakness, fever and body aches. Has blisters in mouth on her tongue.  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  HENT:  Positive for congestion, rhinorrhea, sore throat and trouble swallowing. Negative for sinus pressure.   Respiratory:  Positive for cough.   Neurological:  Positive for weakness.       Objective:   Physical Exam Constitutional:      Appearance: Normal appearance. She is obese.  HENT:     Right Ear: Tympanic membrane normal.     Left Ear: Tympanic membrane normal.     Nose: Congestion and rhinorrhea present.     Comments: Yellowish tick drainage    Mouth/Throat:     Pharynx: Posterior oropharyngeal erythema present.     Comments: Vesicular lesions on bil tonsils Cardiovascular:     Rate and Rhythm: Normal rate and regular rhythm.     Heart sounds: Normal heart sounds.  Pulmonary:     Breath sounds: Normal breath sounds.  Skin:    General: Skin is warm.  Neurological:     General: No focal deficit present.     Mental Status: She is alert and oriented to person, place, and time.  Psychiatric:        Mood and Affect: Mood normal.        Behavior: Behavior normal.   BP 121/89   Pulse 94   Temp (!) 96.3 F (35.7 C) (Temporal)   Ht 6' 1 (1.854 m)   Wt (!) 313 lb (142 kg)   BMI 41.30 kg/m   Covid  neg Flu neg        Assessment & Plan:   Rachel Joseph in today with chief complaint of Weakness, Fever, and Generalized Body Aches   1. Fever, unspecified fever cause (Primary) - Veritor SARS-CoV-2 and Flu A+B  2. Pharyngitis, unspecified etiology Force fluids Motrin  or tylenol  OTC OTC decongestant Throat lozenges if help New toothbrush in 3 days  - cefdinir  (OMNICEF ) 300 MG capsule; Take 1 capsule (300 mg total) by mouth 2 (two) times daily. 1 po BID  Dispense: 20 capsule; Refill: 0    The above assessment and management plan was discussed with the patient. The patient verbalized understanding of and has agreed to the management plan. Patient is aware to call the clinic if symptoms persist or worsen. Patient is aware when to return to the clinic for a follow-up visit. Patient educated on when it is appropriate to go to the emergency department.   Mary-Margaret Gladis, FNP   "

## 2024-03-27 NOTE — Patient Instructions (Signed)

## 2024-06-13 ENCOUNTER — Ambulatory Visit: Payer: Self-pay | Admitting: Nurse Practitioner
